# Patient Record
Sex: Male | Born: 1975 | Race: White | Hispanic: No | Marital: Single | Smoking: Current every day smoker
Health system: Southern US, Community
[De-identification: ages and names within clinical notes are randomized; demographics above are authoritative.]

---

## 2017-03-24 ENCOUNTER — Emergency Department (HOSPITAL_COMMUNITY)
Admission: EM | Admit: 2017-03-24 | Discharge: 2017-03-24 | Disposition: A | Discharge status: Home / Self Care | Payer: Managed Care, Other (non HMO) | Attending: Emergency Medicine | Admitting: Emergency Medicine

## 2017-03-24 ENCOUNTER — Emergency Department (HOSPITAL_COMMUNITY): Payer: Managed Care, Other (non HMO)

## 2017-03-24 ENCOUNTER — Encounter (HOSPITAL_COMMUNITY): Payer: Self-pay

## 2017-03-24 DIAGNOSIS — S59911A Unspecified injury of right forearm, initial encounter: Secondary | ICD-10-CM | POA: Diagnosis present

## 2017-03-24 DIAGNOSIS — F17200 Nicotine dependence, unspecified, uncomplicated: Secondary | ICD-10-CM | POA: Diagnosis not present

## 2017-03-24 DIAGNOSIS — Y939 Activity, unspecified: Secondary | ICD-10-CM | POA: Insufficient documentation

## 2017-03-24 DIAGNOSIS — Y929 Unspecified place or not applicable: Secondary | ICD-10-CM | POA: Insufficient documentation

## 2017-03-24 DIAGNOSIS — S52041A Displaced fracture of coronoid process of right ulna, initial encounter for closed fracture: Secondary | ICD-10-CM

## 2017-03-24 DIAGNOSIS — Y999 Unspecified external cause status: Secondary | ICD-10-CM | POA: Diagnosis not present

## 2017-03-24 MED ORDER — KETOROLAC TROMETHAMINE 60 MG/2ML IM SOLN
60.0000 mg | Freq: Once | INTRAMUSCULAR | Status: DC
  Filled 2017-03-24: qty 2

## 2017-03-24 MED ORDER — HYDROCODONE-ACETAMINOPHEN 5-325 MG PO TABS
1.0000 | ORAL_TABLET | Freq: Once | ORAL | Status: DC
  Filled 2017-03-24: qty 1

## 2017-03-24 MED ORDER — IBUPROFEN 800 MG PO TABS: 800.0000 mg | ORAL_TABLET | Freq: Three times a day (TID) | ORAL | 0 refills | Status: AC
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
## 2017-03-24 NOTE — Discharge Instructions (Signed)
You have a possible fracture of your coroinoid process around your elbow.  Use the sling for a couple days during the day.

## 2017-03-24 NOTE — ED Notes (Signed)
Pt refusing pain med at this time
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
## 2017-03-24 NOTE — ED Notes (Signed)
Pt transported to xray 

## 2017-03-31 ENCOUNTER — Encounter: Payer: Self-pay | Admitting: Orthopedic Surgery

## 2017-03-31 ENCOUNTER — Ambulatory Visit (INDEPENDENT_AMBULATORY_CARE_PROVIDER_SITE_OTHER): Payer: Managed Care, Other (non HMO) | Admitting: Orthopedic Surgery

## 2017-03-31 VITALS — BP 143/81 | HR 100 | Ht 75.0 in | Wt 206.0 lb

## 2017-03-31 DIAGNOSIS — S5001XA Contusion of right elbow, initial encounter: Secondary | ICD-10-CM | POA: Diagnosis not present

## 2017-03-31 DIAGNOSIS — S93412A Sprain of calcaneofibular ligament of left ankle, initial encounter: Secondary | ICD-10-CM

## 2017-03-31 DIAGNOSIS — S76312A Strain of muscle, fascia and tendon of the posterior muscle group at thigh level, left thigh, initial encounter: Secondary | ICD-10-CM | POA: Diagnosis not present

## 2017-03-31 DIAGNOSIS — S20211A Contusion of right front wall of thorax, initial encounter: Secondary | ICD-10-CM

## 2017-03-31 NOTE — Patient Instructions (Addendum)
LIGHT DUTY X 2 WEEKS , EDIT REPORTS AND DESK WORK   Steps to Quit Smoking Smoking tobacco can be bad for your health. It can also affect almost every organ in your body. Smoking puts you and people around you at risk for many serious Rodney Stanley-lasting (chronic) diseases. Quitting smoking is hard, but it is one of the best things that you can do for your health. It is never too late to quit. What are the benefits of quitting smoking? When you quit smoking, you lower your risk for getting serious diseases and conditions. They can include:  Lung cancer or lung disease.  Heart disease.  Stroke.  Heart attack.  Not being able to have children (infertility).  Weak bones (osteoporosis) and broken bones (fractures).  If you have coughing, wheezing, and shortness of breath, those symptoms may get better when you quit. You may also get sick less often. If you are pregnant, quitting smoking can help to lower your chances of having a baby of low birth weight. What can I do to help me quit smoking? Talk with your doctor about what can help you quit smoking. Some things you can do (strategies) include:  Quitting smoking totally, instead of slowly cutting back how much you smoke over a period of time.  Going to in-person counseling. You are more likely to quit if you go to many counseling sessions.  Using resources and support systems, such as: ? Agricultural engineernline chats with a Veterinary surgeoncounselor. ? Phone quitlines. ? Automotive engineerrinted self-help materials. ? Support groups or group counseling. ? Text messaging programs. ? Mobile phone apps or applications.  Taking medicines. Some of these medicines may have nicotine in them. If you are pregnant or breastfeeding, do not take any medicines to quit smoking unless your doctor says it is okay. Talk with your doctor about counseling or other things that can help you.  Talk with your doctor about using more than one strategy at the same time, such as taking medicines while you are also  going to in-person counseling. This can help make quitting easier. What things can I do to make it easier to quit? Quitting smoking might feel very hard at first, but there is a lot that you can do to make it easier. Take these steps:  Talk to your family and friends. Ask them to support and encourage you.  Call phone quitlines, reach out to support groups, or work with a Veterinary surgeoncounselor.  Ask people who smoke to not smoke around you.  Avoid places that make you want (trigger) to smoke, such as: ? Bars. ? Parties. ? Smoke-break areas at work.  Spend time with people who do not smoke.  Lower the stress in your life. Stress can make you want to smoke. Try these things to help your stress: ? Getting regular exercise. ? Deep-breathing exercises. ? Yoga. ? Meditating. ? Doing a body scan. To do this, close your eyes, focus on one area of your body at a time from head to toe, and notice which parts of your body are tense. Try to relax the muscles in those areas.  Download or buy apps on your mobile phone or tablet that can help you stick to your quit plan. There are many free apps, such as QuitGuide from the [employer] developer(Centers for Disease Control and Prevention). You can find more support from smokefree.gov and other websites.  This information is not intended to replace advice given to you by your health care provider. Make sure you discuss  any questions you have with your health care provider. Document Released: 02/28/2009 Document Revised: 12/31/2015 Document Reviewed: 09/18/2014 Elsevier Interactive Patient Education  2018 [employer].
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
## 2017-04-07 ENCOUNTER — Encounter: Payer: Self-pay | Admitting: Orthopedic Surgery

## 2017-04-14 ENCOUNTER — Ambulatory Visit (INDEPENDENT_AMBULATORY_CARE_PROVIDER_SITE_OTHER): Payer: Managed Care, Other (non HMO) | Admitting: Orthopedic Surgery

## 2017-04-14 ENCOUNTER — Encounter: Payer: Self-pay | Admitting: Orthopedic Surgery

## 2017-04-14 VITALS — BP 129/83 | HR 85 | Ht 75.0 in | Wt 206.0 lb

## 2017-04-14 DIAGNOSIS — S5001XD Contusion of right elbow, subsequent encounter: Secondary | ICD-10-CM

## 2017-04-14 DIAGNOSIS — S93412D Sprain of calcaneofibular ligament of left ankle, subsequent encounter: Secondary | ICD-10-CM | POA: Diagnosis not present
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
## 2017-04-14 NOTE — Patient Instructions (Addendum)
NOTE FOR 3 WEEKS LIGHT DUTY   Steps to Quit Smoking Smoking tobacco can be bad for your health. It can also affect almost every organ in your body. Smoking puts you and people around you at risk for many serious long-lasting (chronic) diseases. Quitting smoking is hard, but it is one of the best things that you can do for your health. It is never too late to quit. What are the benefits of quitting smoking? When you quit smoking, you lower your risk for getting serious diseases and conditions. They can include:  Lung cancer or lung disease.  Heart disease.  Stroke.  Heart attack.  Not being able to have children (infertility).  Weak bones (osteoporosis) and broken bones (fractures).  If you have coughing, wheezing, and shortness of breath, those symptoms may get better when you quit. You may also get sick less often. If you are pregnant, quitting smoking can help to lower your chances of having a baby of low birth weight. What can I do to help me quit smoking? Talk with your doctor about what can help you quit smoking. Some things you can do (strategies) include:  Quitting smoking totally, instead of slowly cutting back how much you smoke over a period of time.  Going to in-person counseling. You are more likely to quit if you go to many counseling sessions.  Using resources and support systems, such as: ? Agricultural engineernline chats with a Veterinary surgeoncounselor. ? Phone quitlines. ? Automotive engineerrinted self-help materials. ? Support groups or group counseling. ? Text messaging programs. ? Mobile phone apps or applications.  Taking medicines. Some of these medicines may have nicotine in them. If you are pregnant or breastfeeding, do not take any medicines to quit smoking unless your doctor says it is okay. Talk with your doctor about counseling or other things that can help you.  Talk with your doctor about using more than one strategy at the same time, such as taking medicines while you are also going to in-person  counseling. This can help make quitting easier. What things can I do to make it easier to quit? Quitting smoking might feel very hard at first, but there is a lot that you can do to make it easier. Take these steps:  Talk to your family and friends. Ask them to support and encourage you.  Call phone quitlines, reach out to support groups, or work with a Veterinary surgeoncounselor.  Ask people who smoke to not smoke around you.  Avoid places that make you want (trigger) to smoke, such as: ? Bars. ? Parties. ? Smoke-break areas at work.  Spend time with people who do not smoke.  Lower the stress in your life. Stress can make you want to smoke. Try these things to help your stress: ? Getting regular exercise. ? Deep-breathing exercises. ? Yoga. ? Meditating. ? Doing a body scan. To do this, close your eyes, focus on one area of your body at a time from head to toe, and notice which parts of your body are tense. Try to relax the muscles in those areas.  Download or buy apps on your mobile phone or tablet that can help you stick to your quit plan. There are many free apps, such as QuitGuide from the [employer] developer(Centers for Disease Control and Prevention). You can find more support from smokefree.gov and other websites.  This information is not intended to replace advice given to you by your health care provider. Make sure you discuss any questions you have with  your health care provider. Document Released: 02/28/2009 Document Revised: 12/31/2015 Document Reviewed: 09/18/2014 Elsevier Interactive Patient Education  2018 [employer].
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
## 2021-06-17 ENCOUNTER — Ambulatory Visit (HOSPITAL_COMMUNITY): Payer: No Typology Code available for payment source | Attending: Family Medicine | Admitting: Physical Therapy

## 2021-06-17 ENCOUNTER — Other Ambulatory Visit: Payer: Self-pay

## 2021-06-17 ENCOUNTER — Encounter (HOSPITAL_COMMUNITY): Payer: Self-pay | Admitting: Physical Therapy

## 2021-06-17 DIAGNOSIS — M545 Low back pain, unspecified: Secondary | ICD-10-CM | POA: Diagnosis present
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
## 2021-06-25 ENCOUNTER — Ambulatory Visit (HOSPITAL_COMMUNITY): Payer: No Typology Code available for payment source | Attending: Family Medicine | Admitting: Physical Therapy

## 2021-06-25 ENCOUNTER — Other Ambulatory Visit: Payer: Self-pay

## 2021-06-25 DIAGNOSIS — M545 Low back pain, unspecified: Secondary | ICD-10-CM | POA: Insufficient documentation

## 2021-06-25 NOTE — Therapy (Signed)
[facility] [address] [place], [place], [address] Phone: [phone]   Fax:  [phone]  Physical Therapy Treatment  Patient Details  Name: Rodney Stanley MRN: [id number] Date of Birth: 07-25-1975 Referring Provider (PT): Terie Purser Pa-C   Encounter Date: 06/25/2021   PT End of Session - 06/25/21 1000     Visit Number 2    Number of Visits 4    Date for PT Re-Evaluation 07/15/21    Authorization Type [employer] Managed    PT Start Time 1004    PT Stop Time 1029    PT Time Calculation (min) 25 min    Activity Tolerance Patient tolerated treatment well    Behavior During Therapy [facility] for tasks assessed/performed             No past medical history on file.  No past surgical history on file.  There were no vitals filed for this visit.   Subjective Assessment - 06/25/21 1005     Subjective Patient reports that he has not been able to complete the exercises 3x/day, but he has been able to manage 2x/day. He states that the exercises don't feel good in the moment, but he does notice an improvement in his pain level shortly after.    Limitations House hold activities;Walking;Standing;Lifting    Patient Stated Goals Reduce pain    Currently in Pain? Yes    Pain Score 1     Pain Location Back    Pain Orientation Right                               [facility] Adult PT Treatment/Exercise - 06/25/21 0001       Lumbar Exercises: Aerobic   Nustep x74min      Manual Therapy   Manual Therapy Passive ROM    Manual therapy comments Thomas stretch 9x20s holds each                     PT Education - 06/25/21 1211     Education Details Hip Flexor tightness and it's relation to LBP and the effects of sitting for extended periods of time.    Person(s) Educated Patient    Methods Explanation;Handout;Demonstration    Comprehension Verbalized understanding;Returned demonstration              PT Short  Term Goals - 06/17/21 0945       PT SHORT TERM GOAL #1   Title Patient will be independent with initial HEP and self-management strategies to improve functional outcomes    Time 2    Period Weeks    Status New    Target Date 07/01/21               PT Long Term Goals - 06/17/21 0945       PT LONG TERM GOAL #1   Title Patient will be independent with advanced HEP and self-management strategies to improve functional outcomes    Time 4    Period Weeks    Status New    Target Date 07/15/21      PT LONG TERM GOAL #2   Title Patient will report at least 80% overall improvement in subjective complaint to indicate improvement in ability to perform ADLs.    Time 4    Period Weeks    Status New    Target Date 07/15/21      PT  LONG TERM GOAL #3   Title Patient will improve FOTO score to predicted value to indicate improvement in functional outcomes    Time 4    Period Weeks    Status New    Target Date 07/15/21      PT LONG TERM GOAL #4   Title Patient will have equal to or 5/5 MMT throughout BLE to improve ability to perform functional mobility, stair ambulation and ADLs.    Time 4    Period Weeks    Status New    Target Date 07/15/21                   Plan - 06/25/21 1000     Clinical Impression Statement Patient tolerated treatment well during today's session and had an improvement in low back pain following completion of the [facility] stretch. The Lt hip did have an increased degree of abduction indicating increased lateral hip tension. The patient was also able to appreciate a difference of the degree of stretch felt when aligned properly. Of note, the patient arrived 14 minutes late to his appointment.    Examination-Activity Limitations Bend;Squat;Stand;Lift;Locomotion Level;Sit    Examination-Participation Restrictions Community Activity;Laundry;Shop;Occupation;Cleaning;Yard Work    Stability/Clinical Decision Making Stable/Uncomplicated    Rehab Potential  Good    PT Frequency 1x / week    PT Duration 4 weeks    PT Treatment/Interventions ADLs/Self Care Home Management;Aquatic Therapy;Biofeedback;Cryotherapy;Psychologist, educational;Contrast Bath;Balance training;Fluidtherapy;Moist Heat;Traction;Ultrasound;Parrafin;Iontophoresis 4mg /ml Dexamethasone;Gait training;Stair training;Neuromuscular re-education;Manual techniques;Functional mobility training;Patient/family education;Therapeutic activities;Therapeutic exercise;Orthotic Fit/Training;Scar mobilization;Compression bandaging;Manual lymph drainage;Passive range of motion;Visual/perceptual remediation/compensation;Dry needling;Energy conservation;Spinal Manipulations;Splinting;Taping;Joint Manipulations;Vasopneumatic Device;Vestibular    PT Next Visit Plan F/U with extension based excercise, proceed as indicated. Core and glute strength progressions as tolerated. Weekly HEP    PT Home Exercise Plan Eval: POE, prone press ups, Prone SIJ Anterior Rotation Mobilization on Table, and Thomas Stretch on Table    Consulted and Agree with Plan of Care Patient             Patient will benefit from skilled therapeutic intervention in order to improve the following deficits and impairments:  Pain, Improper body mechanics, Increased fascial restricitons, Decreased range of motion, Decreased strength, Impaired perceived functional ability  Visit Diagnosis: Low back pain, unspecified back pain laterality, unspecified chronicity, unspecified whether sciatica present     Problem List There are no problems to display for this patient.   , PT 06/25/2021, 12:17 PM   [facility] [address] [place], [place], [place] Phone: [phone]   Fax:  [phone]  Name: Rodney Stanley MRN: Rodney Stanley Date of Birth: March 13, 1976

## 2021-07-02 ENCOUNTER — Ambulatory Visit (HOSPITAL_COMMUNITY): Payer: No Typology Code available for payment source | Admitting: Physical Therapy

## 2021-07-02 ENCOUNTER — Other Ambulatory Visit: Payer: Self-pay

## 2021-07-02 DIAGNOSIS — M545 Low back pain, unspecified: Secondary | ICD-10-CM | POA: Diagnosis not present

## 2021-07-02 NOTE — Therapy (Signed)
[facility] [address] [place], [place], [address] Phone: [phone]   Fax:  [phone]  Physical Therapy Treatment  Patient Details  Name: Calen Geister MRN: [id number] Date of Birth: 03-19-1976 Referring Provider (PT): Terie Purser Pa-C   Encounter Date: 07/02/2021   PT End of Session - 07/02/21 0951     Visit Number 3    Number of Visits 4    Date for PT Re-Evaluation 07/15/21    Authorization Type [employer] Managed    PT Start Time [phone]    PT Stop Time 1027    PT Time Calculation (min) 35 min    Activity Tolerance Patient tolerated treatment well    Behavior During Therapy [facility] for tasks assessed/performed             No past medical history on file.  No past surgical history on file.  There were no vitals filed for this visit.   Subjective Assessment - 07/02/21 0953     Subjective Patient reports that the new stretch went well at home and he found more success with the stretch using the forward lunge rather than the [facility] stretch. His back pain has improved since the previous session.    Limitations House hold activities;Walking;Standing;Lifting    Patient Stated Goals Reduce pain    Currently in Pain? Yes    Pain Score 1     Pain Location Back    Pain Orientation Right                               [facility] Adult PT Treatment/Exercise - 07/02/21 0001       Lumbar Exercises: Aerobic   Elliptical x52min      Lumbar Exercises: Standing   Other Standing Lumbar Exercises Unilateral farmer carries with 20lbs and BTB 3x130ft each    Other Standing Lumbar Exercises --      Lumbar Exercises: Seated   Other Seated Lumbar Exercises Seated sustained paloff press(green) with march 2x30s ea      Manual Therapy   Manual Therapy Passive ROM    Manual therapy comments Thomas stretch 9x20s holds each                       PT Short Term Goals - 06/17/21 0945       PT SHORT TERM GOAL  #1   Title Patient will be independent with initial HEP and self-management strategies to improve functional outcomes    Time 2    Period Weeks    Status New    Target Date 07/01/21               PT Long Term Goals - 06/17/21 0945       PT LONG TERM GOAL #1   Title Patient will be independent with advanced HEP and self-management strategies to improve functional outcomes    Time 4    Period Weeks    Status New    Target Date 07/15/21      PT LONG TERM GOAL #2   Title Patient will report at least 80% overall improvement in subjective complaint to indicate improvement in ability to perform ADLs.    Time 4    Period Weeks    Status New    Target Date 07/15/21      PT LONG TERM GOAL #3   Title Patient will improve FOTO score to  predicted value to indicate improvement in functional outcomes    Time 4    Period Weeks    Status New    Target Date 07/15/21      PT LONG TERM GOAL #4   Title Patient will have equal to or 5/5 MMT throughout BLE to improve ability to perform functional mobility, stair ambulation and ADLs.    Time 4    Period Weeks    Status New    Target Date 07/15/21                   Plan - 07/02/21 0951     Clinical Impression Statement Patient tolerated treatment well during today's session and and was challenged by the stabilization exercises. There was an increase in difficulty for resisted Lt torso rotation as well as Lt sidebending during today's tasks. Low back pain has essentially resolved.    Examination-Activity Limitations Bend;Squat;Stand;Lift;Locomotion Level;Sit    Examination-Participation Restrictions Community Activity;Laundry;Shop;Occupation;Cleaning;Yard Work    Stability/Clinical Decision Making Stable/Uncomplicated    Rehab Potential Good    PT Frequency 1x / week    PT Duration 4 weeks    PT Treatment/Interventions ADLs/Self Care Home Management;Aquatic Therapy;Biofeedback;Cryotherapy;Investment banker, operational;Contrast Bath;Balance training;Fluidtherapy;Moist Heat;Traction;Ultrasound;Parrafin;Iontophoresis 4mg /ml Dexamethasone;Gait training;Stair training;Neuromuscular re-education;Manual techniques;Functional mobility training;Patient/family education;Therapeutic activities;Therapeutic exercise;Orthotic Fit/Training;Scar mobilization;Compression bandaging;Manual lymph drainage;Passive range of motion;Visual/perceptual remediation/compensation;Dry needling;Energy conservation;Spinal Manipulations;Splinting;Taping;Joint Manipulations;Vasopneumatic Device;Vestibular    PT Next Visit Plan Patient to be discharged during tnext session assuming no regression in function has occurred.    PT Home Exercise Plan Eval: POE, prone press ups, Prone SIJ Anterior Rotation Mobilization on Table, and Thomas Stretch on Table    Consulted and Agree with Plan of Care Patient             Patient will benefit from skilled therapeutic intervention in order to improve the following deficits and impairments:  Pain, Improper body mechanics, Increased fascial restricitons, Decreased range of motion, Decreased strength, Impaired perceived functional ability  Visit Diagnosis: Low back pain, unspecified back pain laterality, unspecified chronicity, unspecified whether sciatica present     Problem List There are no problems to display for this patient.   , PT 07/02/2021, 10:34 AM  [facility] [address] [place], [place], [place] Phone: [phone]   Fax:  [phone]  Name: Mordche Hedglin MRN: Lorenso Courier Date of Birth: February 17, 1976

## 2021-07-08 ENCOUNTER — Other Ambulatory Visit: Payer: Self-pay

## 2021-07-08 ENCOUNTER — Encounter (HOSPITAL_COMMUNITY): Payer: Self-pay | Admitting: Physical Therapy

## 2021-07-08 ENCOUNTER — Ambulatory Visit (HOSPITAL_COMMUNITY): Payer: No Typology Code available for payment source | Admitting: Physical Therapy

## 2021-07-08 DIAGNOSIS — M545 Low back pain, unspecified: Secondary | ICD-10-CM | POA: Diagnosis not present

## 2021-07-08 NOTE — Patient Instructions (Signed)
Access Code: [email or web address] URL: [email or web address] Date: 07/08/2021 Prepared by: Georges Lynch  Exercises Supine Dead Bug with Leg Extension - 1-2 x daily - 7 x weekly - 2 sets - 10 reps Bird Dog - 1-2 x daily - 7 x weekly - 2 sets - 10 reps Side Plank on Knees - 1-2 x daily - 7 x weekly - 1 sets - 3 reps - 30 second hold Standard Plank - 1-2 x daily - 7 x weekly - 1 sets - 3 reps - 30 second hold

## 2021-07-08 NOTE — Therapy (Signed)
[facility] [address] [place], [place], [address] Phone: [phone]   Fax:  [phone]  Physical Therapy Treatment  Patient Details  Name: Rodney Stanley MRN: [id number] Date of Birth: July 17, 1975 Referring Provider (PT): Delman Cheadle Pa-C  Progress Note Reporting Period 06/17/21 to 07/08/21  See note below for Objective Data and Assessment of Progress/Goals.     Encounter Date: 07/08/2021   PT End of Session - 07/08/21 0913     Visit Number 4    Number of Visits 8    Date for PT Re-Evaluation 08/05/21    Authorization Type [employer] Managed    PT Start Time [phone]    PT Stop Time 0945    PT Time Calculation (min) 39 min    Activity Tolerance Patient tolerated treatment well    Behavior During Therapy [facility] for tasks assessed/performed             History reviewed. No pertinent past medical history.  History reviewed. No pertinent surgical history.  There were no vitals filed for this visit.   Subjective Assessment - 07/08/21 0910     Subjective Patient says he is doing better. He still has pain with lifting over 25 lbs, but walking around and day to day activities are much better.    Currently in Pain? No/denies                [facility] PT Assessment - 07/08/21 0001       Assessment   Medical Diagnosis [place] RT side    Referring Provider (PT) Delman Cheadle Pa-C    Prior Therapy No      Precautions   Precautions None      Restrictions   Weight Bearing Restrictions No      Balance Screen   Has the patient fallen in the past 6 months No      [place] residence      Prior Function   Level of Independence Independent      Cognition   Overall Cognitive Status Within Functional Limits for tasks assessed      Observation/Other Assessments   Focus on Therapeutic Outcomes (FOTO)  67% function   was 49%     AROM   Lumbar Flexion 10% limited    Lumbar Extension  20% limited    Lumbar - Right Side Bend WNL    Lumbar - Left Side Bend WNL      Strength   Right Hip Flexion 4+/5    Right Hip Extension 5/5    Right Hip ABduction 5/5    Left Hip Flexion 4+/5    Left Hip ABduction 4+/5    Right Knee Extension 5/5    Left Knee Extension 5/5      Palpation   Palpation comment no tenderness on palpation noted today lumbar spine                           [facility] Adult PT Treatment/Exercise - 07/08/21 0001       Lumbar Exercises: Supine   Dead Bug 10 reps      Lumbar Exercises: Sidelying   Other Sidelying Lumbar Exercises side plank from knees 30" each      Lumbar Exercises: Prone   Other Prone Lumbar Exercises front plank 30"      Lumbar Exercises: Quadruped   Opposite Arm/Leg Raise 10 reps  PT Short Term Goals - 07/08/21 0944       PT SHORT TERM GOAL #1   Title Patient will be independent with initial HEP and self-management strategies to improve functional outcomes    Time 2    Period Weeks    Status Achieved    Target Date 07/01/21               PT Long Term Goals - 07/08/21 0944       PT LONG TERM GOAL #1   Title Patient will be independent with advanced HEP and self-management strategies to improve functional outcomes    Time 4    Period Weeks    Status On-going    Target Date 07/15/21      PT LONG TERM GOAL #2   Title Patient will report at least 80% overall improvement in subjective complaint to indicate improvement in ability to perform ADLs.    Baseline Reports 70%    Time 4    Period Weeks    Status On-going    Target Date 07/15/21      PT LONG TERM GOAL #3   Title Patient will improve FOTO score to predicted value to indicate improvement in functional outcomes    Time 4    Period Weeks    Status Achieved    Target Date 07/15/21      PT LONG TERM GOAL #4   Title Patient will have equal to or 5/5 MMT throughout BLE to improve ability to perform  functional mobility, stair ambulation and ADLs.    Baseline See MMT    Time 4    Period Weeks    Status Partially Met    Target Date 07/15/21                   Plan - 07/08/21 0932     Clinical Impression Statement Patient shows steady improvement toward goals. Patient demos improved strength and lumbar mobility but remains pain limited with resisted activity which continues to negatively impact ADLs and work tasks. Patient will continue to benefit from skilled therapy services to progress core strengthening exercise for decreased pain and return to PLOF with ADLs and work tasks.    Examination-Activity Limitations Bend;Squat;Stand;Lift;Locomotion Level;Sit    Examination-Participation Restrictions Community Activity;Laundry;Shop;Occupation;Cleaning;Yard Work    Stability/Clinical Decision Making Stable/Uncomplicated    Rehab Potential Good    PT Frequency 1x / week    PT Duration 4 weeks    PT Treatment/Interventions ADLs/Self Care Home Management;Aquatic Therapy;Biofeedback;Cryotherapy;Scientist, product/process development;Contrast Bath;Balance training;Fluidtherapy;Moist Heat;Traction;Ultrasound;Parrafin;Iontophoresis 48m/ml Dexamethasone;Gait training;Stair training;Neuromuscular re-education;Manual techniques;Functional mobility training;Patient/family education;Therapeutic activities;Therapeutic exercise;Orthotic Fit/Training;Scar mobilization;Compression bandaging;Manual lymph drainage;Passive range of motion;Visual/perceptual remediation/compensation;Dry needling;Energy conservation;Spinal Manipulations;Splinting;Taping;Joint Manipulations;Vasopneumatic Device;Vestibular    PT Next Visit Plan Continue to progress hip and core strength for return to PLOF with ADLs and work tasks    PT Home Exercise Plan Eval: POE, prone press ups, Prone SIJ Anterior Rotation Mobilization on Table, and TAdvertising account planneron Table    Consulted and Agree with Plan of Care Patient              Patient will benefit from skilled therapeutic intervention in order to improve the following deficits and impairments:  Pain, Improper body mechanics, Increased fascial restricitons, Decreased range of motion, Decreased strength, Impaired perceived functional ability  Visit Diagnosis: Low back pain, unspecified back pain laterality, unspecified chronicity, unspecified whether sciatica present     Problem List There are no problems to display for this patient.  9:46 AM, 07/08/21 Rodney Stanley PT DPT  Physical Therapist with [facility]  ([phone] [place], [place], [address] Phone: [phone]   Fax:  [phone]  Name: Rodney Stanley MRN: [id number] Date of Birth: Feb 13, 1976

## 2021-07-15 ENCOUNTER — Ambulatory Visit (HOSPITAL_COMMUNITY): Payer: No Typology Code available for payment source | Admitting: Physical Therapy

## 2021-07-15 ENCOUNTER — Encounter (HOSPITAL_COMMUNITY): Payer: Self-pay | Admitting: Physical Therapy

## 2021-07-15 ENCOUNTER — Other Ambulatory Visit: Payer: Self-pay

## 2021-07-15 DIAGNOSIS — M545 Low back pain, unspecified: Secondary | ICD-10-CM | POA: Diagnosis not present

## 2021-07-15 NOTE — Therapy (Signed)
[facility] [place], [place], [address] Phone: [phone]   Fax:  [phone]  Physical Therapy Treatment  Patient Details  Name: Rodney Stanley MRN: [id number] Date of Birth: 1975/07/23 Referring Provider (PT): Delman Cheadle Pa-C   Encounter Date: 07/15/2021   PT End of Session - 07/15/21 8937     Visit Number 5    Number of Visits 8    Date for PT Re-Evaluation 08/05/21    Authorization Type [employer] Managed    PT Start Time [phone]    PT Stop Time 1031    PT Time Calculation (min) 38 min    Activity Tolerance Patient tolerated treatment well    Behavior During Therapy [facility] for tasks assessed/performed             History reviewed. No pertinent past medical history.  History reviewed. No pertinent surgical history.  There were no vitals filed for this visit.   Subjective Assessment - 07/15/21 0954     Subjective Doing well. Has been doing HEP without issues. Prone press ups seem to be helping. Most stiff in the AM but otherwise good.    Currently in Pain? No/denies                               [facility] Adult PT Treatment/Exercise - 07/15/21 0001       Lumbar Exercises: Standing   Other Standing Lumbar Exercises 25 lb box lift x10      Lumbar Exercises: Supine   Ab Set 10 reps    Bent Knee Raise 10 reps    Dead Bug 20 reps   2 sets of 10   Bridge 20 reps   1 set 10 alternating knee extension, 1 set 10 single leg bridge     Lumbar Exercises: Sidelying   Other Sidelying Lumbar Exercises side plank from knees 3 x 30" each      Lumbar Exercises: Quadruped   Opposite Arm/Leg Raise 20 reps   2 set s of 10   Plank 3 x 30"                       PT Short Term Goals - 07/08/21 0944       PT SHORT TERM GOAL #1   Title Patient will be independent with initial HEP and self-management strategies to improve functional outcomes    Time 2    Period Weeks    Status Achieved     Target Date 07/01/21               PT Long Term Goals - 07/08/21 0944       PT LONG TERM GOAL #1   Title Patient will be independent with advanced HEP and self-management strategies to improve functional outcomes    Time 4    Period Weeks    Status On-going    Target Date 07/15/21      PT LONG TERM GOAL #2   Title Patient will report at least 80% overall improvement in subjective complaint to indicate improvement in ability to perform ADLs.    Baseline Reports 70%    Time 4    Period Weeks    Status On-going    Target Date 07/15/21      PT LONG TERM GOAL #3   Title Patient will improve FOTO score to predicted value to indicate improvement in functional  outcomes    Time 4    Period Weeks    Status Achieved    Target Date 07/15/21      PT LONG TERM GOAL #4   Title Patient will have equal to or 5/5 MMT throughout BLE to improve ability to perform functional mobility, stair ambulation and ADLs.    Baseline See MMT    Time 4    Period Weeks    Status Partially Met    Target Date 07/15/21                   Plan - 07/15/21 1021     Clinical Impression Statement Patient tolerated session well today. Added single leg bridge and progressed side plank to full length position. Patient cued on proper form with each. Patient shows improved core stability but still requires min cueing for LT hip elevation during birddog. Also added box lifts for lifting mechanics, patient cued on proper form. Patient will continue to benefit from skilled therapy services to reduce deficits and improve functional level.    Examination-Activity Limitations Bend;Squat;Stand;Lift;Locomotion Level;Sit    Examination-Participation Restrictions Community Activity;Laundry;Shop;Occupation;Cleaning;Yard Work    Stability/Clinical Decision Making Stable/Uncomplicated    Rehab Potential Good    PT Frequency 1x / week    PT Duration 4 weeks    PT Treatment/Interventions ADLs/Self Care Home  Management;Aquatic Therapy;Biofeedback;Cryotherapy;Scientist, product/process development;Contrast Bath;Balance training;Fluidtherapy;Moist Heat;Traction;Ultrasound;Parrafin;Iontophoresis 74m/ml Dexamethasone;Gait training;Stair training;Neuromuscular re-education;Manual techniques;Functional mobility training;Patient/family education;Therapeutic activities;Therapeutic exercise;Orthotic Fit/Training;Scar mobilization;Compression bandaging;Manual lymph drainage;Passive range of motion;Visual/perceptual remediation/compensation;Dry needling;Energy conservation;Spinal Manipulations;Splinting;Taping;Joint Manipulations;Vasopneumatic Device;Vestibular    PT Next Visit Plan Continue to progress hip and core strength for return to PLOF with ADLs and work tasks    PT Home Exercise Plan Eval: POE, prone press ups, Prone SIJ Anterior Rotation Mobilization on Table, and TArvilla Marketon Table 2/21 Access Code: [email or web address] single leg bridge, full side plank.    Consulted and Agree with Plan of Care Patient             Patient will benefit from skilled therapeutic intervention in order to improve the following deficits and impairments:  Pain, Improper body mechanics, Increased fascial restricitons, Decreased range of motion, Decreased strength, Impaired perceived functional ability  Visit Diagnosis: Low back pain, unspecified back pain laterality, unspecified chronicity, unspecified whether sciatica present     Problem List There are no problems to display for this patient.  10:32 AM, 07/15/21 CJosue HectorPT DPT  [facility] [phone]  [facility] [address]Phone: [phone]  Fax:  [phone] Name: Rodney WilczakMRN: [id number] of Birth: 7January 05, 1977

## 2021-07-21 ENCOUNTER — Other Ambulatory Visit: Payer: Self-pay

## 2021-07-21 ENCOUNTER — Encounter (HOSPITAL_COMMUNITY): Payer: Self-pay | Admitting: Physical Therapy

## 2021-07-21 ENCOUNTER — Ambulatory Visit (HOSPITAL_COMMUNITY): Payer: No Typology Code available for payment source | Attending: Family Medicine | Admitting: Physical Therapy

## 2021-07-21 DIAGNOSIS — M545 Low back pain, unspecified: Secondary | ICD-10-CM | POA: Insufficient documentation

## 2021-07-21 NOTE — Therapy (Signed)
Rodney Stanley ?[address] ?[place], [place], [address] ?Phone: [phone]   Fax:  [phone] ? ?Physical Therapy Treatment ? ?Patient Details  ?Name: Rodney Stanley ?MRN: [id number] ?Date of Birth: 09-22-1975 ?Referring Provider (PT): Delman Cheadle Pa-C ? ? ?Encounter Date: 07/21/2021 ? ? PT End of Session - 07/21/21 0904   ? ? Visit Number 6   ? Number of Visits 8   ? Date for PT Re-Evaluation 08/05/21   ? Authorization Type [employer] Managed   ? PT Start Time 0900   ? PT Stop Time [phone]   ? PT Time Calculation (min) 42 min   ? Activity Tolerance Patient tolerated treatment well   ? Behavior During Therapy [facility] for tasks assessed/performed   ? ?  ?  ? ?  ? ? ?History reviewed. No pertinent past medical history. ? ?History reviewed. No pertinent surgical history. ? ?There were no vitals filed for this visit. ? ? Subjective Assessment - 07/21/21 0902   ? ? Subjective Patient reports that he has been doing well at home, but notices that he is still getting sore/stiff in the mornings. He is still able to eliminate this following stretching, but would like to get it to the point where it doesn't even happen in the first place.   ? Currently in Pain? No/denies   ? ?  ?  ? ?  ? ? ? ? ? ? ? ? ? ? ? ? ? ? ? ? ? ? ? ? [facility] Adult PT Treatment/Exercise - 07/21/21 0001   ? ?  ? Lumbar Exercises: Stretches  ? Other Lumbar Stretch Exercise Supine pelvic rotation with feet on wall 3x30s   ?  ? Lumbar Exercises: Aerobic  ? Elliptical x74mn   ?  ? Lumbar Exercises: Standing  ? Other Standing Lumbar Exercises Band resisted lumbar extension w/ PPT and slow march 1x10 green and 1x10 blue   ?  ? Lumbar Exercises: Sidelying  ? Other Sidelying Lumbar Exercises Pelvic lifts(on white foam with lumbar cushion) 3x12 each   ?  ? Lumbar Exercises: Quadruped  ? Other Quadruped Lumbar Exercises Pelvic lift(from black foam and half foam roll) 3x8x3xs holds each   ? ?  ?  ? ?  ? ? ? ? ? ? ? ? ? ? ? ? PT Short  Term Goals - 07/08/21 0944   ? ?  ? PT SHORT TERM GOAL #1  ? Title Patient will be independent with initial HEP and self-management strategies to improve functional outcomes   ? Time 2   ? Period Weeks   ? Status Achieved   ? Target Date 07/01/21   ? ?  ?  ? ?  ? ? ? ? PT Long Term Goals - 07/08/21 0944   ? ?  ? PT LONG TERM GOAL #1  ? Title Patient will be independent with advanced HEP and self-management strategies to improve functional outcomes   ? Time 4   ? Period Weeks   ? Status On-going   ? Target Date 07/15/21   ?  ? PT LONG TERM GOAL #2  ? Title Patient will report at least 80% overall improvement in subjective complaint to indicate improvement in ability to perform ADLs.   ? Baseline Reports 70%   ? Time 4   ? Period Weeks   ? Status On-going   ? Target Date 07/15/21   ?  ? PT LONG TERM GOAL #3  ? Title  Patient will improve FOTO score to predicted value to indicate improvement in functional outcomes   ? Time 4   ? Period Weeks   ? Status Achieved   ? Target Date 07/15/21   ?  ? PT LONG TERM GOAL #4  ? Title Patient will have equal to or 5/5 MMT throughout BLE to improve ability to perform functional mobility, stair ambulation and ADLs.   ? Baseline See MMT   ? Time 4   ? Period Weeks   ? Status Partially Met   ? Target Date 07/15/21   ? ?  ?  ? ?  ? ? ? ? ? ? ? ? Plan - 07/21/21 0928   ? ? Clinical Impression Statement Patient tolerated treatment well during today's session and was able to consistently maintain proper form following verbal and visual cueing. During the quadriped pelvic lifting exercise, there was a difference in elevation with the Rt side being the lesser of the two. Supine pelvic lifting was difficult for the patient to coordinate and movement was disjointed.   ? Examination-Activity Limitations Bend;Squat;Stand;Lift;Locomotion Level;Sit   ? Examination-Participation Restrictions Community Activity;Laundry;Shop;Occupation;Cleaning;Valla Leaver Work   ? Stability/Clinical Decision Making  Stable/Uncomplicated   ? Rehab Potential Good   ? PT Frequency 1x / week   ? PT Duration 4 weeks   ? PT Treatment/Interventions ADLs/Self Care Home Management;Aquatic Therapy;Biofeedback;Cryotherapy;Scientist, product/process development;Contrast Bath;Balance training;Fluidtherapy;Moist Heat;Traction;Ultrasound;Parrafin;Iontophoresis 36m/ml Dexamethasone;Gait training;Stair training;Neuromuscular re-education;Manual techniques;Functional mobility training;Patient/family education;Therapeutic activities;Therapeutic exercise;Orthotic Fit/Training;Scar mobilization;Compression bandaging;Manual lymph drainage;Passive range of motion;Visual/perceptual remediation/compensation;Dry needling;Energy conservation;Spinal Manipulations;Splinting;Taping;Joint Manipulations;Vasopneumatic Device;Vestibular   ? PT Next Visit Plan Continue to progress hip and core strength for return to PLOF with ADLs and work tasks   ? PT Home Exercise Plan Eval: POE, prone press ups, Prone SIJ Anterior Rotation Mobilization on Table, and Thomas Stretch on Table 2/21 Access Code: [email or web address] single leg bridge, full side plank.   ? Consulted and Agree with Plan of Care Patient   ? ?  ?  ? ?  ? ? ?Patient will benefit from skilled therapeutic intervention in order to improve the following deficits and impairments:  Pain, Improper body mechanics, Increased fascial restricitons, Decreased range of motion, Decreased strength, Impaired perceived functional ability ? ?Visit Diagnosis: ?Low back pain, unspecified back pain laterality, unspecified chronicity, unspecified whether sciatica present ? ? ? ? ?Problem List ?There are no problems to display for this patient. ? ? ?CAdalberto Cole PT ?07/21/2021, 9:45 AM ? ?[place] ?[place]?[address]?[place] [address]?Phone: [phone]  Fax:  [phone]? ?Name: Rodney Stanley?MRN: [id number]?Date of Birth: 7Jul 03, 1977? ? ? ?

## 2021-07-29 ENCOUNTER — Encounter (HOSPITAL_COMMUNITY): Payer: Self-pay | Admitting: Physical Therapy

## 2021-07-29 ENCOUNTER — Other Ambulatory Visit: Payer: Self-pay

## 2021-07-29 ENCOUNTER — Ambulatory Visit (HOSPITAL_COMMUNITY): Payer: No Typology Code available for payment source | Admitting: Physical Therapy

## 2021-07-29 DIAGNOSIS — M545 Low back pain, unspecified: Secondary | ICD-10-CM | POA: Diagnosis not present

## 2021-07-29 NOTE — Therapy (Signed)
?OUTPATIENT PHYSICAL THERAPY TREATMENT NOTE ? ? ?Patient Name: Rodney Stanley ?MRN: [id number] ?DOB:Dec 18, 1975, 46 y.o., male ?Today's Date: 07/29/2021 ? ?PCP: Patient, No Pcp Per (Inactive) ?REFERRING PROVIDER: Jake Samples, PA* ? ? PT End of Session - 07/29/21 0909   ? ? Visit Number 7   ? Number of Visits 8   ? Date for PT Re-Evaluation 08/05/21   ? Authorization Type [employer] Managed   ? PT Start Time [phone]   ? PT Stop Time 7494   ? PT Time Calculation (min) 40 min   ? Activity Tolerance Patient tolerated treatment well   ? Behavior During Therapy [facility] for tasks assessed/performed   ? ?  ?  ? ?  ? ? ?History reviewed. No pertinent past medical history. ?History reviewed. No pertinent surgical history. ?There are no problems to display for this patient. ? ? ?REFERRING DIAG: Sciatica Right Side Sciatica Right Side  ? ?THERAPY DIAG:  ?Low back pain, unspecified back pain laterality, unspecified chronicity, unspecified whether sciatica present ? ? ?SUBJECTIVE: Patient states he is doing well today. No pain currently. Feels 90% improved and feels ready for DC. He was able to help move furniture last week with minimal stiffness but was able to perform HEP with good results.  ? ?PAIN:  ?Are you having pain? No ? ? ? ? ?TODAY'S TREATMENT:  ?07/29/21 ?Elliptical 5 min warmup  ?HEP review ?Body mechanics review with machine dead lifts 10 plates x10  ? ? ? ?LUMBAR ROM:  ? ?Active  A/PROM  ?07/29/21  ?Flexion 90% ROM  ?Extension 90% ROM   ?Right lateral flexion WNL  ?Left lateral flexion WNL  ? ? ? ? ?LE MMT: ? ?MMT Right ?07/29/21 Left ?07/29/21  ?Hip flexion 4+ 4+  ?Hip extension 5 5  ?Hip abduction 5 4+  ?Hip adduction    ?Hip internal rotation    ?Hip external rotation    ?Knee flexion    ?Knee extension 4+ 4+  ?Ankle dorsiflexion    ?Ankle plantarflexion    ?Ankle inversion    ?Ankle eversion    ? (Blank rows = not tested) ? ?PATIENT EDUCATION: ?Education details: on reassessment findings, HEP and DC  ?Person educated:  Patient ?Education method: Explanation and Demonstration ?Education comprehension: verbalized understanding and returned demonstration ? ? ?HOME EXERCISE PROGRAM: ?Eval: POE, prone press ups, Prone SIJ Anterior Rotation Mobilization on Table, and Thomas Stretch on Table 2/21 Access Code: [email or web address]/28 single leg bridge, full side plank.  ? ? PT Short Term Goals - 07/08/21 0944   ? ?  ? PT SHORT TERM GOAL #1  ? Title Patient will be independent with initial HEP and self-management strategies to improve functional outcomes   ? Time 2   ? Period Weeks   ? Status Achieved   ? Target Date 07/01/21   ? ?  ?  ? ?  ? ? ? PT Long Term Goals - 07/29/21 0938   ? ?  ? PT LONG TERM GOAL #1  ? Title Patient will be independent with advanced HEP and self-management strategies to improve functional outcomes   ? Baseline Reviewed and answered all patient questions   ? Time 4   ? Period Weeks   ? Status Achieved   ? Target Date 07/15/21   ?  ? PT LONG TERM GOAL #2  ? Title Patient will report at least 80% overall improvement in subjective complaint to indicate improvement in ability to perform ADLs.   ? Baseline  Reports 90%   ? Time 4   ? Period Weeks   ? Status Achieved   ? Target Date 07/15/21   ?  ? PT LONG TERM GOAL #3  ? Title Patient will improve FOTO score to predicted value to indicate improvement in functional outcomes   ? Baseline See FOTO   ? Time 4   ? Period Weeks   ? Status Achieved   ? Target Date 07/15/21   ?  ? PT LONG TERM GOAL #4  ? Title Patient will have equal to or 5/5 MMT throughout BLE to improve ability to perform functional mobility, stair ambulation and ADLs.   ? Baseline See MMT   ? Time 4   ? Period Weeks   ? Status Partially Met   ? Target Date 07/15/21   ? ?  ?  ? ?  ? ?Plan - 07/21/21 0928   ?  ?  Clinical Impression Statement Patient has made good progress toward therapy goals and is currently with all goals met/ partially MET. Patient showing improved strength and lumbar ROM. Reviewed comprehensive  HEP and answered all patient questions. Reviewed body mechanics with deadlift patient showing good return. Patient being DC today and encouraged to follow up with therapy services with any further questions or concerns.   ?  Examination-Activity Limitations Bend;Squat;Stand;Lift;Locomotion Level;Sit   ?  Examination-Participation Restrictions Community Activity;Laundry;Shop;Occupation;Cleaning;Valla Leaver Work   ?  Stability/Clinical Decision Making Stable/Uncomplicated   ?  Rehab Potential Good   ?  PT Frequency 1x / week   ?  PT Duration 4 weeks   ?  PT Treatment/Interventions ADLs/Self Care Home Management;Aquatic Therapy;Biofeedback;Cryotherapy;Scientist, product/process development;Contrast Bath;Balance training;Fluidtherapy;Moist Heat;Traction;Ultrasound;Parrafin;Iontophoresis 18m/ml Dexamethasone;Gait training;Stair training;Neuromuscular re-education;Manual techniques;Functional mobility training;Patient/family education;Therapeutic activities;Therapeutic exercise;Orthotic Fit/Training;Scar mobilization;Compression bandaging;Manual lymph drainage;Passive range of motion;Visual/perceptual remediation/compensation;Dry needling;Energy conservation;Spinal Manipulations;Splinting;Taping;Joint Manipulations;Vasopneumatic Device;Vestibular   ?  PT Next Visit Plan DC to HEP   ?  PT Home Exercise Plan   ? ? ? ? ?9:48 AM, 07/29/21 ?CJosue HectorPT DPT  ?[facility] ?[phone]? ?   ?

## 2021-08-05 ENCOUNTER — Encounter (HOSPITAL_COMMUNITY): Payer: Managed Care, Other (non HMO) | Admitting: Physical Therapy
